# Patient Record
Sex: Male | Born: 1995 | Race: Black or African American | Hispanic: No | Marital: Single | Smoking: Current every day smoker
Health system: Southern US, Community
[De-identification: ages and names within clinical notes are randomized; demographics above are authoritative.]

## PROBLEM LIST (undated history)

## (undated) HISTORY — PX: WRIST FRACTURE SURGERY: SHX121

---
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
## 2019-12-21 ENCOUNTER — Encounter (HOSPITAL_COMMUNITY): Payer: Self-pay

## 2019-12-21 ENCOUNTER — Ambulatory Visit (INDEPENDENT_AMBULATORY_CARE_PROVIDER_SITE_OTHER): Payer: Self-pay

## 2019-12-21 ENCOUNTER — Ambulatory Visit (HOSPITAL_COMMUNITY)
Admission: EM | Admit: 2019-12-21 | Discharge: 2019-12-21 | Disposition: A | Discharge status: Home / Self Care | Payer: Self-pay | Attending: Internal Medicine | Admitting: Internal Medicine

## 2019-12-21 ENCOUNTER — Other Ambulatory Visit: Payer: Self-pay

## 2019-12-21 DIAGNOSIS — S6992XA Unspecified injury of left wrist, hand and finger(s), initial encounter: Secondary | ICD-10-CM

## 2019-12-21 DIAGNOSIS — S62325A Displaced fracture of shaft of fourth metacarpal bone, left hand, initial encounter for closed fracture: Secondary | ICD-10-CM

## 2019-12-21 DIAGNOSIS — M7989 Other specified soft tissue disorders: Secondary | ICD-10-CM

## 2019-12-21 DIAGNOSIS — M79642 Pain in left hand: Secondary | ICD-10-CM

## 2019-12-21 DIAGNOSIS — S62323A Displaced fracture of shaft of third metacarpal bone, left hand, initial encounter for closed fracture: Secondary | ICD-10-CM

## 2019-12-21 MED ORDER — ACETAMINOPHEN 325 MG PO TABS: 650.0000 mg | ORAL_TABLET | Freq: Four times a day (QID) | ORAL | 0 refills | Status: AC | PRN

## 2019-12-21 MED ORDER — IBUPROFEN 400 MG PO TABS: 400.0000 mg | ORAL_TABLET | Freq: Four times a day (QID) | ORAL | 0 refills | Status: AC | PRN

## 2019-12-21 NOTE — Discharge Instructions (Signed)
Wear the splint at all times  Dr. Ronnell [place] office will call you today or tomorrow to discuss follow up, verify they have your information please, call the office number supplied  Tylenol and ibuprofen for pain, every 6 hours as needed Ice the back of your hand through the splint

## 2019-12-21 NOTE — ED Notes (Signed)
Called ortho and they said they will come shortly.
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
## 2019-12-22 ENCOUNTER — Other Ambulatory Visit: Payer: Self-pay | Admitting: Orthopedic Surgery

## 2019-12-23 ENCOUNTER — Encounter (HOSPITAL_BASED_OUTPATIENT_CLINIC_OR_DEPARTMENT_OTHER): Payer: Self-pay | Admitting: Orthopedic Surgery

## 2019-12-23 ENCOUNTER — Other Ambulatory Visit: Payer: Self-pay

## 2019-12-23 ENCOUNTER — Other Ambulatory Visit (HOSPITAL_COMMUNITY): Payer: Self-pay | Attending: Orthopedic Surgery
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
## 2019-12-24 NOTE — Progress Notes (Signed)
Left message for patient re need to go for covid test.  Address and time given.  Notified Dr. Carollee Massed office also.

## 2019-12-26 ENCOUNTER — Encounter (HOSPITAL_COMMUNITY): Payer: Self-pay | Admitting: Anesthesiology

## 2019-12-27 ENCOUNTER — Ambulatory Visit (HOSPITAL_BASED_OUTPATIENT_CLINIC_OR_DEPARTMENT_OTHER): Admission: RE | Admit: 2019-12-27 | Payer: Self-pay | Source: Home / Self Care | Admitting: Orthopedic Surgery

## 2019-12-27 SURGERY — OPEN REDUCTION INTERNAL FIXATION (ORIF) METACARPAL
Anesthesia: Monitor Anesthesia Care | Site: Finger | Laterality: Left

## 2021-05-24 IMAGING — DX DG HAND COMPLETE 3+V*L*
3 series · 3 of 3 positions shown · non-contrast
Comparison: None.

CLINICAL DATA: Left hand pain and swelling

EXAM:
LEFT HAND - COMPLETE 3+ VIEW

[hand pa]
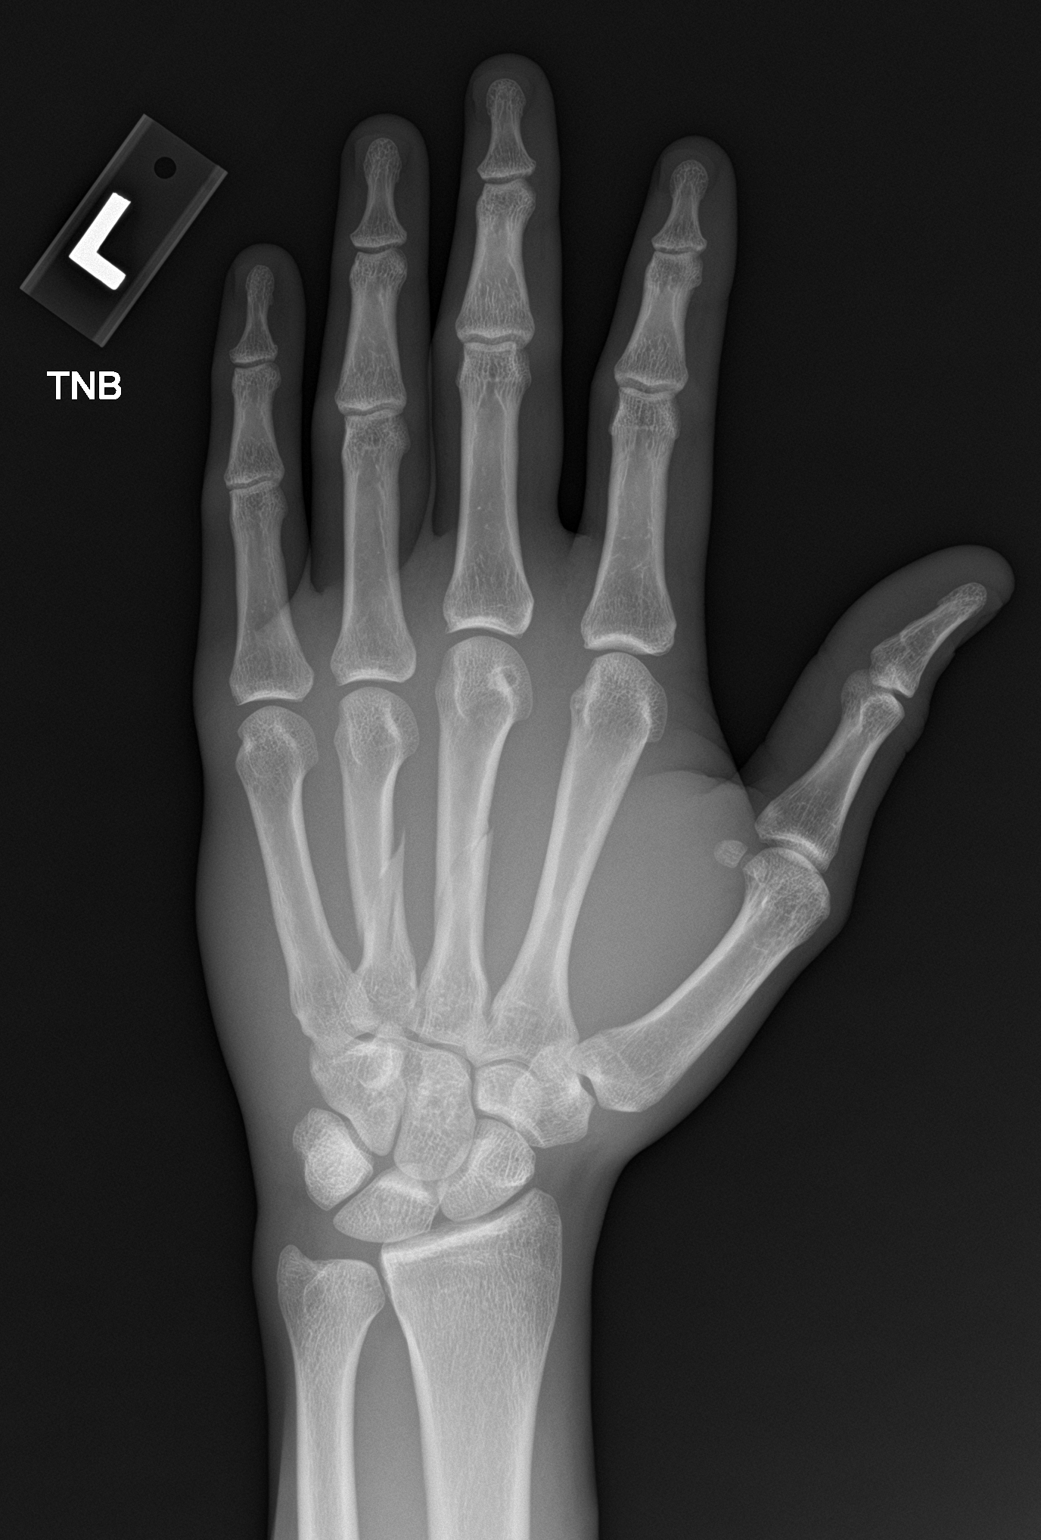

[hand obl]
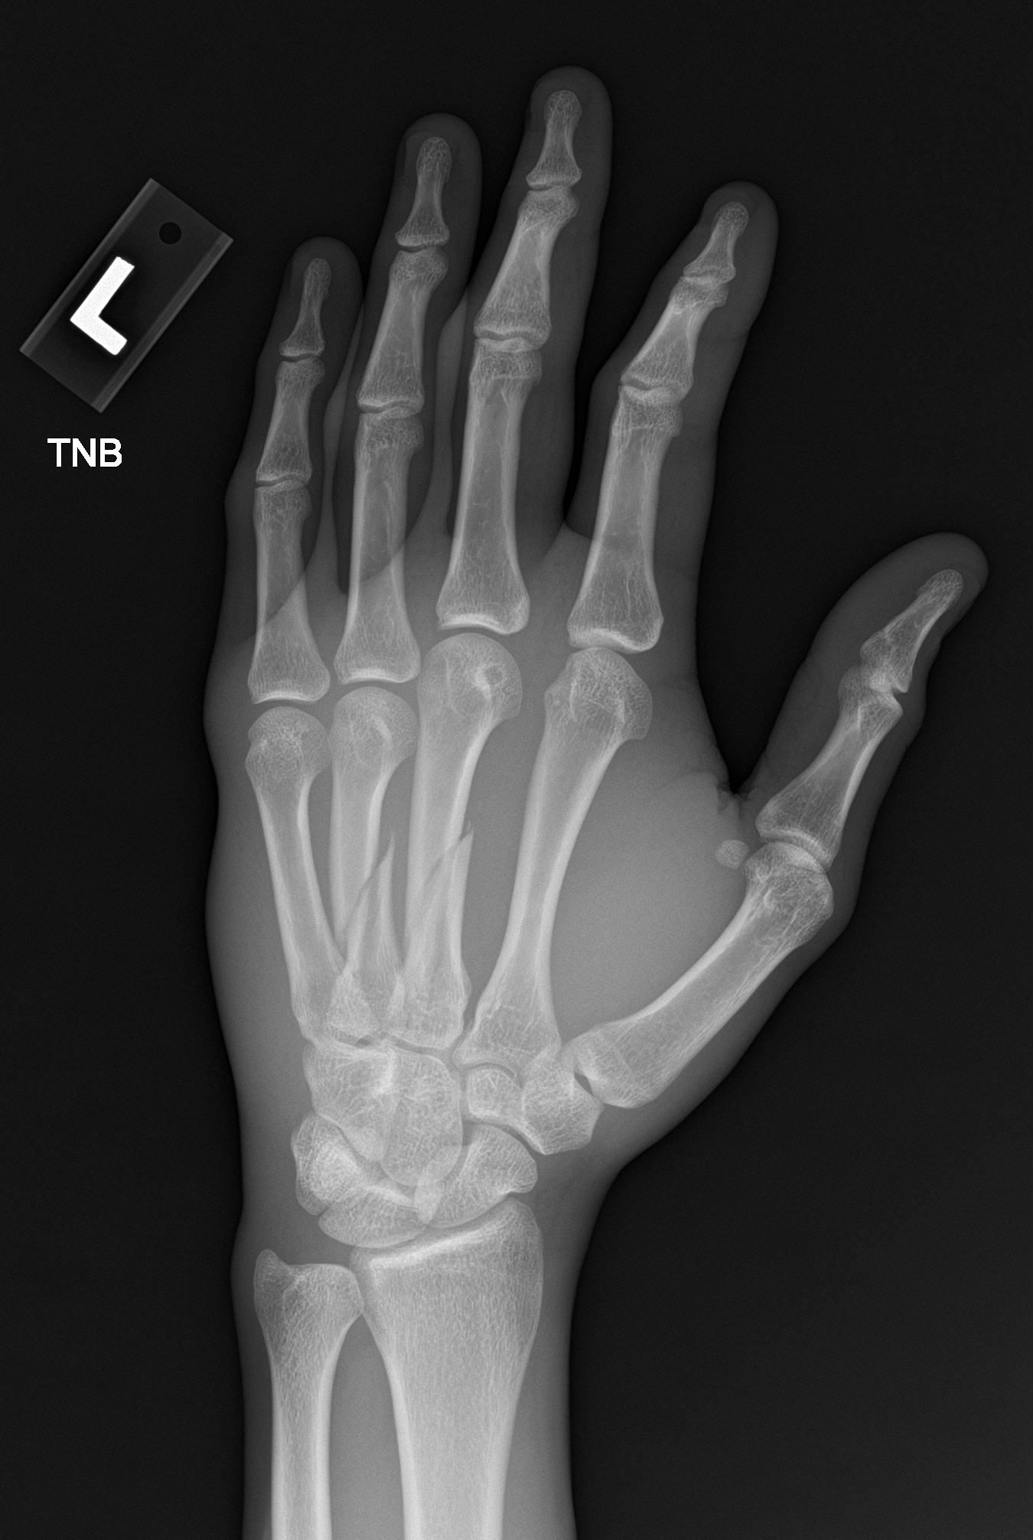

[hand lat]
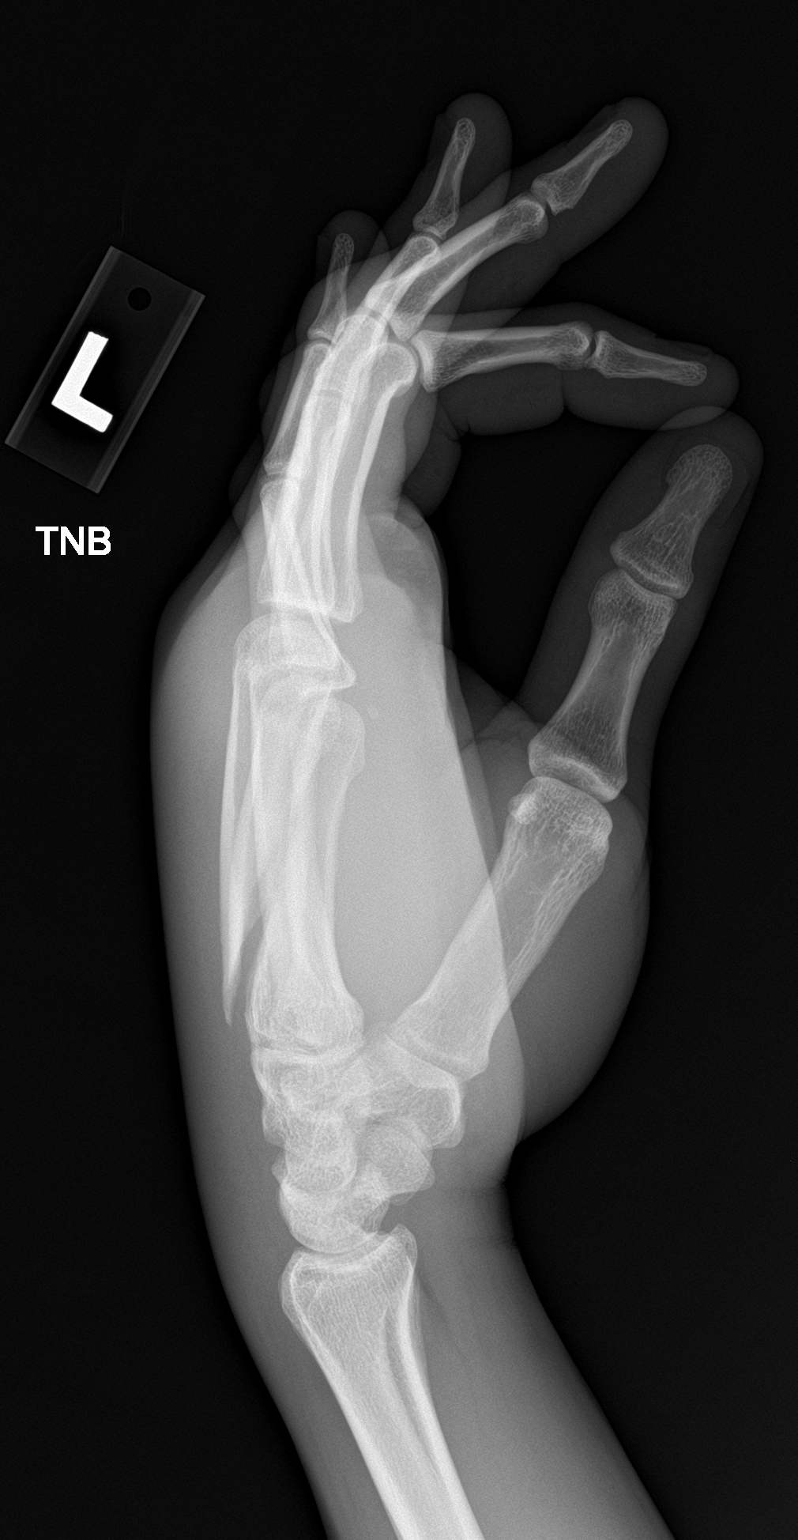

[3 of 3 positions shown; findings below may reference images not displayed]

FINDINGS: Acute obliquely oriented fractures through the proximal and mid
diaphyses of the third and fourth metacarpals with mild displacement
dorsally and ulnarly. No significant angulation. No evidence of
intra-articular extension. No additional fracture identified. There
is prominent soft tissue swelling overlying the dorsum of the hand.
No significant arthropathy.
IMPRESSION: Acute mildly displaced fractures of the third and fourth
metacarpals.

These results will be called to the ordering clinician or
representative by the Radiologist Assistant, and communication
documented in the PACS or [REDACTED].

## 2022-11-24 ENCOUNTER — Other Ambulatory Visit: Payer: Self-pay | Admitting: Certified Nurse Midwife

## 2022-11-24 DIAGNOSIS — A493 Mycoplasma infection, unspecified site: Secondary | ICD-10-CM

## 2022-11-24 MED ORDER — AZITHROMYCIN 500 MG PO TABS: 1000.0000 mg | ORAL_TABLET | Freq: Once | ORAL | 0 refills | Status: AC

## 2022-11-24 MED ORDER — DOXYCYCLINE HYCLATE 100 MG PO TABS: 100.0000 mg | ORAL_TABLET | Freq: Two times a day (BID) | ORAL | 0 refills | Status: AC

## 2022-11-24 MED ORDER — AZITHROMYCIN 500 MG PO TABS: 500.0000 mg | ORAL_TABLET | Freq: Every day | ORAL | 0 refills | Status: AC

## 2022-11-24 NOTE — Progress Notes (Signed)
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  Edd Arbour, CNM, MSN, IBCLC Certified Nurse Midwife, [facility]
